# Patient Record
Sex: Male | Born: 1995 | Race: Black or African American | Hispanic: No | Marital: Single | Smoking: Never smoker
Health system: Southern US, Community
[De-identification: ages and names within clinical notes are randomized; demographics above are authoritative.]

## PROBLEM LIST (undated history)

## (undated) DIAGNOSIS — F900 Attention-deficit hyperactivity disorder, predominantly inattentive type: Secondary | ICD-10-CM

## (undated) DIAGNOSIS — H109 Unspecified conjunctivitis: Principal | ICD-10-CM

## (undated) HISTORY — DX: Attention-deficit hyperactivity disorder, predominantly inattentive type: F90.0

## (undated) HISTORY — DX: Unspecified conjunctivitis: H10.9

---
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
## 2017-03-15 ENCOUNTER — Ambulatory Visit: Payer: Managed Care, Other (non HMO)

## 2017-03-29 ENCOUNTER — Ambulatory Visit: Payer: Managed Care, Other (non HMO)

## 2017-03-30 ENCOUNTER — Ambulatory Visit (INDEPENDENT_AMBULATORY_CARE_PROVIDER_SITE_OTHER): Payer: 59 | Admitting: Pediatrics

## 2017-03-30 DIAGNOSIS — Z23 Encounter for immunization: Secondary | ICD-10-CM | POA: Diagnosis not present

## 2017-04-02 NOTE — Progress Notes (Signed)
Presented today for flu vaccine. No new questions on vaccine. Parent was counseled on risks benefits of vaccine and parent verbalized understanding. Handout (VIS) given for each vaccine. 

## 2017-12-16 ENCOUNTER — Ambulatory Visit (INDEPENDENT_AMBULATORY_CARE_PROVIDER_SITE_OTHER): Payer: 59 | Admitting: Pediatrics

## 2017-12-16 ENCOUNTER — Encounter: Payer: Self-pay | Admitting: Pediatrics

## 2017-12-16 VITALS — BP 120/80 | Ht 71.25 in | Wt 160.8 lb

## 2017-12-16 DIAGNOSIS — Z23 Encounter for immunization: Secondary | ICD-10-CM

## 2017-12-16 DIAGNOSIS — Z0000 Encounter for general adult medical examination without abnormal findings: Secondary | ICD-10-CM

## 2017-12-16 DIAGNOSIS — Z68.41 Body mass index (BMI) pediatric, 5th percentile to less than 85th percentile for age: Secondary | ICD-10-CM | POA: Diagnosis not present

## 2017-12-16 DIAGNOSIS — Z00129 Encounter for routine child health examination without abnormal findings: Secondary | ICD-10-CM

## 2017-12-16 NOTE — Patient Instructions (Signed)
Preventive Care for [place], Male The transition to life after high school as a young adult can be a stressful time with many changes. You may start seeing a primary care physician instead of a pediatrician. This is the time when your health care becomes your responsibility. Preventive care refers to lifestyle choices and visits with your health care provider that can promote health and wellness. What does preventive care include?  A yearly physical exam. This is also called an annual wellness visit.  Dental exams once or twice a year.  Routine eye exams. Ask your health care provider how often you should have your eyes checked.  Personal lifestyle choices, including: ? Daily care of your teeth and gums. ? Regular physical activity. ? Eating a healthy diet. ? Avoiding tobacco and drug use. ? Avoiding or limiting alcohol use. ? Practicing safe sex. What happens during an annual wellness visit? Preventive care starts with a yearly visit to your primary care physician. The services and screenings done by your health care provider during your annual wellness visit will depend on your overall health, lifestyle risk factors, and family history of disease. Counseling Your health care provider may ask you questions about:  Past medical problems and your family's medical history.  Medicines or supplements that you take.  Health insurance and access to health care.  Alcohol, tobacco, and drug use, including use of any bodybuilding drugs (anabolic steroids).  Your safety at home, work, or school.  Access to firearms.  Emotional well-being and how you cope with stress.  Relationship well-being.  Diet, exercise, and sleep habits.  Your sexual health and activity.  Screening You may have the following tests or measurements:  Height, weight, and BMI.  Blood pressure.  Lipid and cholesterol levels.  Tuberculosis skin test.  Skin exam.  Vision and hearing tests.  Genital  exam to check for testicular cancer or hernias.  Screening test for hepatitis.  Screening tests for STDs (sexually transmitted diseases), if you are at risk.  Vaccines Your health care provider may recommend certain vaccines, such as:  Influenza vaccine. This is recommended every year.  Tetanus, diphtheria, and acellular pertussis (Tdap, Td) vaccine. You may need a Td booster every 10 years.  Varicella vaccine. You may need this if you have not been vaccinated.  HPV vaccine. If you are 8 or younger, you may need three doses over 6 months.  Measles, mumps, and rubella (MMR) vaccine. You may need at least one dose of MMR. You may also need a second dose.  Pneumococcal 13-valent conjugate (PCV13) vaccine. You may need this if you have certain conditions and have not been vaccinated.  Pneumococcal polysaccharide (PPSV23) vaccine. You may need one or two doses if you smoke cigarettes or if you have certain conditions.  Meningococcal vaccine. One dose is recommended if you are age 83-21 years and a first-year college student living in a residence hall, or if you have one of several medical conditions. You may also need additional booster doses.  Hepatitis A vaccine. You may need this if you have certain conditions or if you travel or work in places where you may be exposed to hepatitis A.  Hepatitis B vaccine. You may need this if you have certain conditions or if you travel or work in places where you may be exposed to hepatitis B.  Haemophilus influenzae type b (Hib) vaccine. You may need this if you have certain risk factors.  Talk to your health care provider about which  screenings and vaccines you need and how often you need them. What steps can I take to develop healthy behaviors?  Have regular preventive health care visits with your primary care physician and dentist.  Eat a healthy diet.  Drink enough fluid to keep your urine clear or pale yellow.  Stay active. Exercise at  least 30 minutes 5 or more days of the week.  Use alcohol responsibly.  Maintain a healthy weight.  Do not use any products that contain nicotine, such as cigarettes, chewing tobacco, and e-cigarettes. If you need help quitting, ask your health care provider.  Do not use drugs.  Practice safe sex. This includes using condoms to prevent STDs or an unwanted pregnancy.  Find healthy ways to manage stress. How can I protect myself from injury? Injuries from violence or accidents are the leading cause of death among young adults and can often be prevented. Take these steps to help protect yourself:  Always wear your seat belt while driving or riding in a vehicle.  Do not drive if you have been drinking alcohol. Do not ride with someone who has been drinking.  Do not drive when you are tired or distracted. Do not text while driving.  Wear a helmet and other protective equipment during sports activities.  If you have firearms in your house, make sure you follow all gun safety procedures.  Seek help if you have been bullied, physically abused, or sexually abused.  Avoid fighting.  Use the Internet responsibly to avoid dangers such as online bullying.  What can I do to cope with stress? Young adults may face many new challenges that can be stressful, such as finding a job, going to college, moving away from home, managing money, being in a relationship, getting married, and having children. To manage stress:  Avoid known stressful situations when you can.  Exercise regularly.  Find a stress-reducing activity that works best for you. Examples include meditation, yoga, listening to music, or reading.  Spend time in nature.  Keep a journal to write about your stress and how you respond.  Talk to your health care provider about stress. He or she may suggest counseling.  Spend time with supportive friends or family.  Do not cope with stress by: ? Drinking alcohol or using  drugs. ? Smoking cigarettes. ? Eating.  Where can I get more information? Learn more about preventive care and healthy habits from:  U.S. Preventive Services Task Force: StageSync.si  National Adolescent and [place]: StrategicRoad.nl  American Academy of Pediatrics Bright Futures: https://brightfutures.MemberVerification.co.za  Society for Adolescent Health and Medicine: MoralBlog.co.za.aspx  PodExchange.nl: ToyLending.fr  This information is not intended to replace advice given to you by your health care provider. Make sure you discuss any questions you have with your health care provider. Document Released: 09/25/2015 Document Revised: 10/16/2015 Document Reviewed: 09/25/2015 Elsevier Interactive Patient Education  [employer].

## 2017-12-16 NOTE — Progress Notes (Signed)
Adolescent Well Care Visit Steve Thomas is a 22 y.o. male who is here for well care.    PCP:  Georgiann Hahn, MD   History was provided by the patient.  Confidentiality was discussed with the patient and, if applicable, with caregiver as well.    Current Issues: Current concerns include none.   Nutrition: Nutrition/Eating Behaviors: good Adequate calcium in diet?: yes Supplements/ Vitamins: yes  Exercise/ Media: Play any Sports?/ Exercise: yes Screen Time:  < 2 hours Media Rules or Monitoring?: yes  Sleep:  Sleep: 8 hours  Social Screening: Lives with:  parents Parental relations:  good Activities, Work, and Regulatory affairs officer?: yes Concerns regarding behavior with peers?  no Stressors of note: no  Education:   In college School performance: doing well; no concerns School Behavior: doing well; no concerns  Menstruation:   No LMP for male patient. Menstrual History: N/A   Confidential Social History: Tobacco?  no Secondhand smoke exposure?  no Drugs/ETOH?  no  Sexually Active?  no   Pregnancy Prevention: n/a  Safe at home, in school & in relationships?  Yes Safe to self?  Yes   Screenings: Patient has a dental home: yes  The patient completed the Rapid Assessment for Adolescent Preventive Services screening questionnaire and the following topics were identified as risk factors and discussed: healthy eating, exercise, seatbelt use, bullying, abuse/trauma, weapon use, tobacco use, marijuana use, drug use and condom use  In addition, the following topics were discussed as part of anticipatory guidance sexuality, suicidality/self harm, mental health issues, social isolation, school problems, family problems and screen time.  PHQ-9 completed and results indicated no risks  Physical Exam:  Vitals:   12/16/17 1156  BP: 120/80  Weight: 160 lb 12.8 oz (72.9 kg)  Height: 5' 11.25" (1.81 m)   BP 120/80   Ht 5' 11.25" (1.81 m)   Wt 160 lb 12.8 oz (72.9 kg)   BMI  22.27 kg/m  Body mass index: body mass index is 22.27 kg/m. Blood pressure percentiles are not available for patients who are 18 years or older.   Hearing Screening   125Hz  250Hz  500Hz  1000Hz  2000Hz  3000Hz  4000Hz  6000Hz  8000Hz   Right ear:   25 20 20 20 20     Left ear:   25 20 20 20 20       Visual Acuity Screening   Right eye Left eye Both eyes  Without correction: 10/10 10/10   With correction:       General Appearance:   alert, oriented, no acute distress and well nourished  HENT: Normocephalic, no obvious abnormality, conjunctiva clear  Mouth:   Normal appearing teeth, no obvious discoloration, dental caries, or dental caps  Neck:   Supple; thyroid: no enlargement, symmetric, no tenderness/mass/nodules  Chest normal  Lungs:   Clear to auscultation bilaterally, normal work of breathing  Heart:   Regular rate and rhythm, S1 and S2 normal, no murmurs;   Abdomen:   Soft, non-tender, no mass, or organomegaly  GU normal male genitals, no testicular masses or hernia  Musculoskeletal:   Tone and strength strong and symmetrical, all extremities               Lymphatic:   No cervical adenopathy  Skin/Hair/Nails:   Skin warm, dry and intact, no rashes, no bruises or petechiae  Neurologic:   Strength, gait, and coordination normal and age-appropriate     Assessment and Plan:   Well Young Adult  BMI is appropriate for age  Hearing screening  result:normal Vision screening result: normal  Counseling provided for all of the vaccine components  Orders Placed This Encounter  Procedures  . Meningococcal B, OMV (Bexsero)   Indications, contraindications and side effects of vaccine/vaccines discussed with parent and parent verbally expressed understanding and also agreed with the administration of vaccine/vaccines as ordered above today.    Return in about 1 year (around 12/17/2018).Georgiann Hahn.  Eliud Polo, MD

## 2018-03-15 ENCOUNTER — Ambulatory Visit (INDEPENDENT_AMBULATORY_CARE_PROVIDER_SITE_OTHER): Payer: 59 | Admitting: Pediatrics

## 2018-03-15 DIAGNOSIS — Z23 Encounter for immunization: Secondary | ICD-10-CM | POA: Diagnosis not present

## 2018-03-16 NOTE — Progress Notes (Signed)
Flu vaccine per orders. Indications, contraindications and side effects of vaccine/vaccines discussed with parent and parent verbally expressed understanding and also agreed with the administration of vaccine/vaccines as ordered above today.Handout (VIS) given for each vaccine at this visit. ° °
